# Patient Record
Sex: Female | Born: 1995
Health system: Southern US, Community
[De-identification: ages and names within clinical notes are randomized; demographics above are authoritative.]

## PROBLEM LIST (undated history)

## (undated) DIAGNOSIS — N83209 Unspecified ovarian cyst, unspecified side: Secondary | ICD-10-CM

## (undated) HISTORY — PX: TYMPANOSTOMY TUBE PLACEMENT: SHX32

---
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
## 2016-03-13 IMAGING — CR DG CHEST 2V
2 series · 2 of 2 positions shown · non-contrast
Comparison: Chest radiograph April 19, 2013

CLINICAL DATA: Chest pain radiating to LEFT breast, total body
tingling and hyperventilation.

EXAM:
CHEST  2 VIEW

[w chest pa]
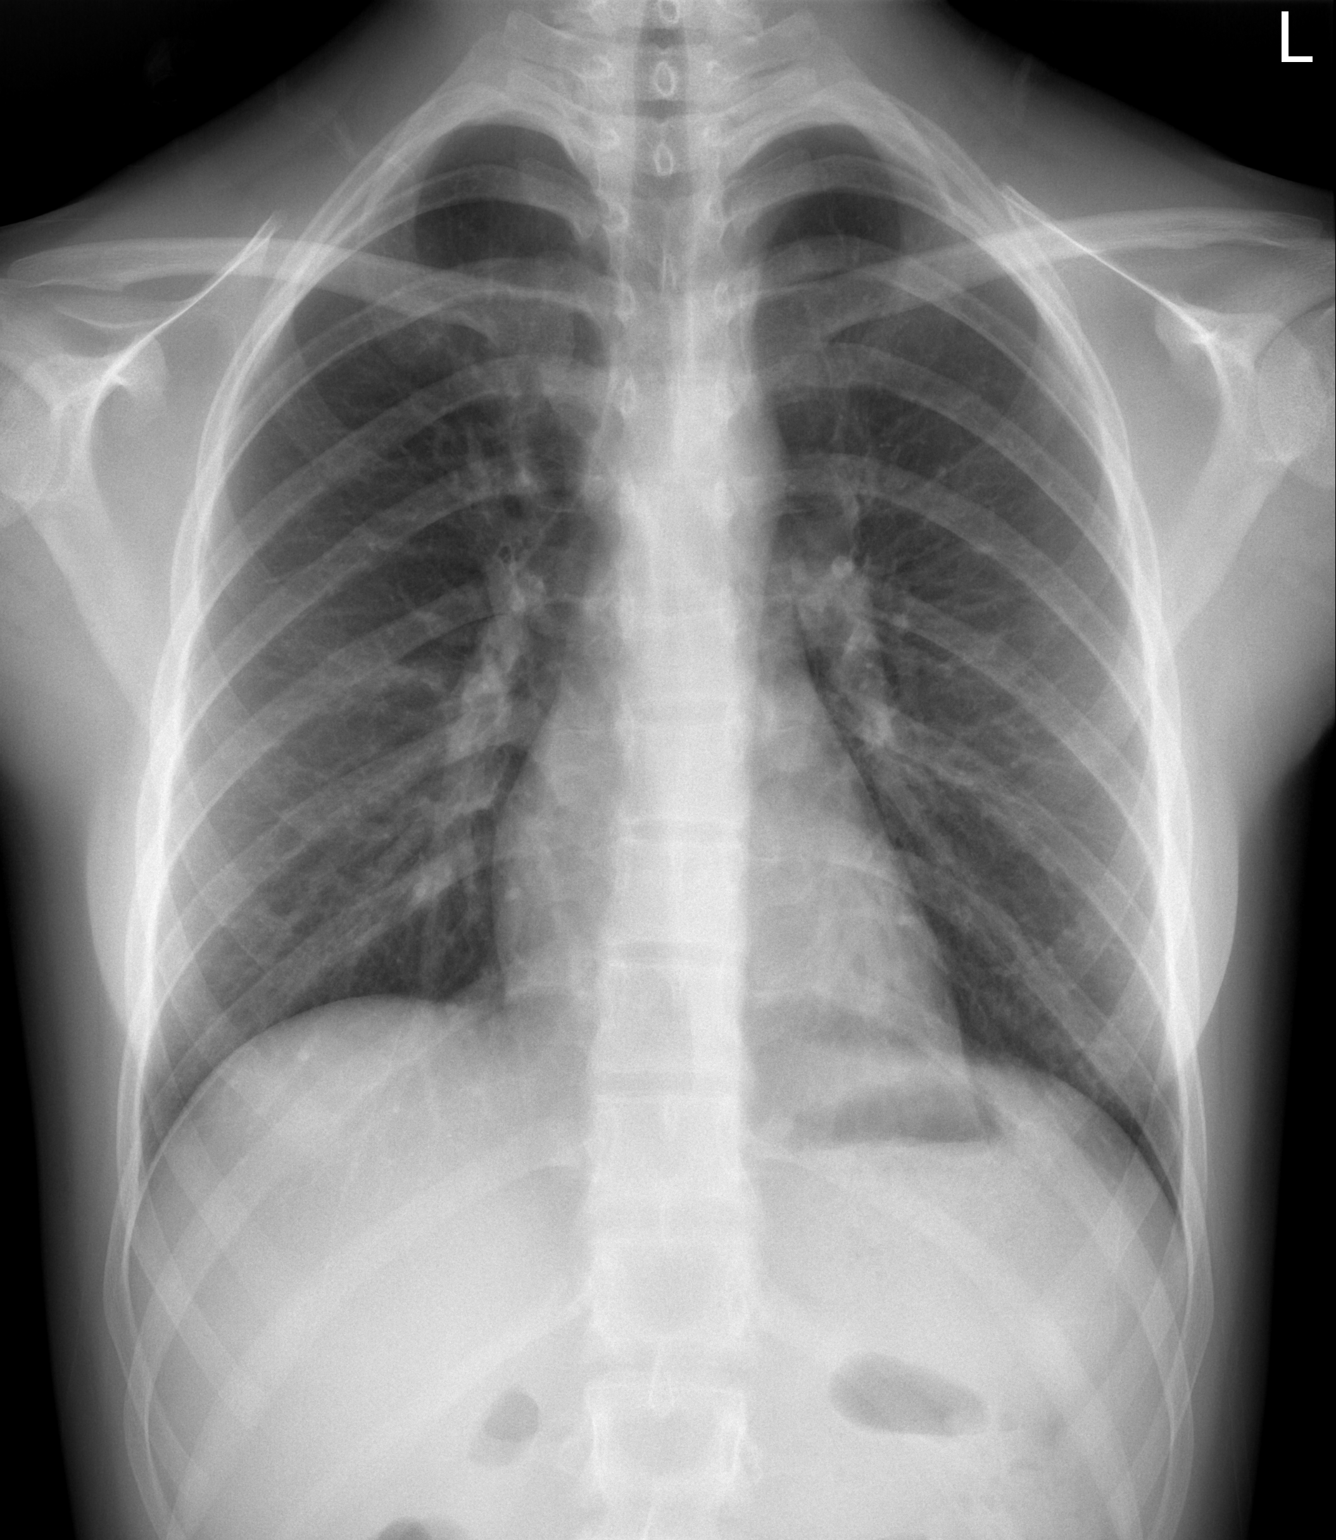

[w chest lat]
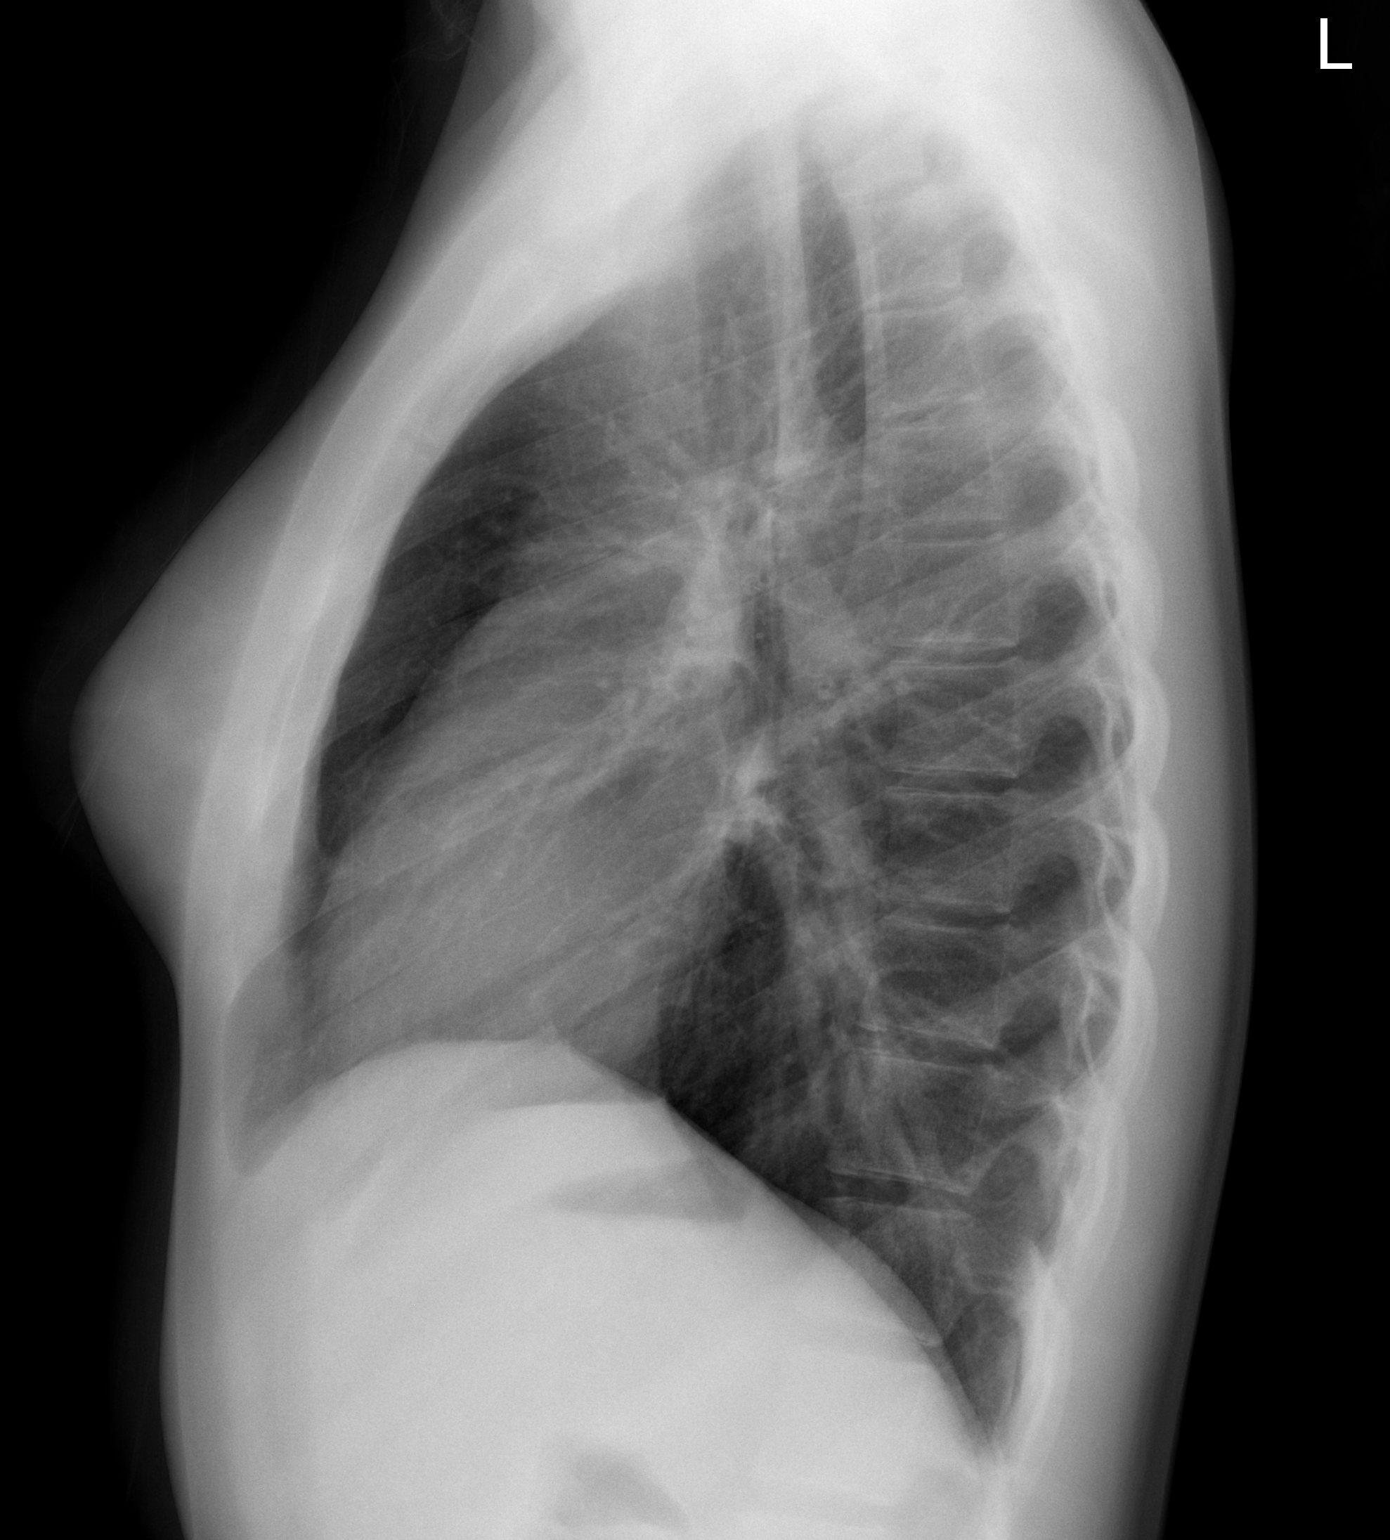

[2 of 2 positions shown; findings below may reference images not displayed]

FINDINGS: Cardiomediastinal silhouette is unremarkable. The lungs are clear
without pleural effusions or focal consolidations. Trachea projects
midline and there is no pneumothorax. Soft tissue planes and
included osseous structures are non-suspicious.
IMPRESSION: Normal chest.

  By: Adilson Eduardo Alcina

## 2016-07-16 ENCOUNTER — Encounter (HOSPITAL_BASED_OUTPATIENT_CLINIC_OR_DEPARTMENT_OTHER): Payer: Self-pay | Admitting: *Deleted

## 2016-07-16 ENCOUNTER — Emergency Department (HOSPITAL_BASED_OUTPATIENT_CLINIC_OR_DEPARTMENT_OTHER)
Admission: EM | Admit: 2016-07-16 | Discharge: 2016-07-16 | Disposition: A | Discharge status: Home / Self Care | Payer: Medicaid Other | Attending: Emergency Medicine | Admitting: Emergency Medicine

## 2016-07-16 DIAGNOSIS — K0889 Other specified disorders of teeth and supporting structures: Secondary | ICD-10-CM | POA: Insufficient documentation

## 2016-07-16 MED ORDER — BUPIVACAINE HCL (PF) 0.5 % IJ SOLN
5.0000 mL | Freq: Once | INTRAMUSCULAR | Status: DC
  Filled 2016-07-16: qty 10

## 2016-07-16 MED ORDER — AMOXICILLIN-POT CLAVULANATE 875-125 MG PO TABS: 1.0000 | ORAL_TABLET | Freq: Two times a day (BID) | ORAL | 0 refills | Status: AC

## 2016-07-16 MED ORDER — BUPIVACAINE-EPINEPHRINE (PF) 0.5% -1:200000 IJ SOLN
1.8000 mL | Freq: Once | INTRAMUSCULAR | Status: AC
  Administered 2016-07-16: 1.8 mL
  Filled 2016-07-16: qty 1.8

## 2016-07-16 MED ORDER — AMOXICILLIN-POT CLAVULANATE 875-125 MG PO TABS: 1.0000 | ORAL_TABLET | Freq: Two times a day (BID) | ORAL | 0 refills | Status: DC

## 2016-07-16 MED FILL — AMOX-CLAV 875-125 MG TABLET: 875-125 | 7 days supply | Qty: 14 | Fill #0

## 2016-07-16 NOTE — ED Triage Notes (Signed)
Possible dental abscess. She called and made an appointment for Friday as the earliest she can see her dentist.

## 2016-07-16 NOTE — ED Provider Notes (Signed)
[facility] Provider Note   CSN: [id number] Arrival date & time: 07/16/16  1212     History   Chief Complaint Chief Complaint  Patient presents with  . Dental Pain    HPI Jennifer Myers is a 21 y.o. female who presents to the Emergency Department for right-sided upper dental pain that began 2 days ago. She reports that her symptoms initially began with dental pain followed by swelling that has continued to worsen over the last 24 hours. No fever, chills, sore throat, left-sided dental pain, difficulty breathing, difficulty swallowing, or otalgia. No recent dental procedures. Patient wears braces on her upper and lower teeth. She is currently applying a warm compress to the area with some improvement. She has an appointment with her dentist on 5/11, the earliest she was able to be seen.   No pertinent PMH. No daily medications.   The history is provided by the patient. No language interpreter was used.    History reviewed. No pertinent past medical history.  Patient Active Problem List   Diagnosis Date Noted  . Injury of left shoulder 06/15/2015    Past Surgical History:  Procedure Laterality Date  . TYMPANOSTOMY TUBE PLACEMENT      OB History    No data available       Home Medications    Prior to Admission medications   Medication Sig Start Date End Date Taking? Authorizing Provider  amoxicillin-clavulanate (AUGMENTIN) 875-125 MG tablet Take 1 tablet by mouth every 12 (twelve) hours. 07/16/16   Damario Gillie A, PA-C  benzocaine (ORAJEL MAXIMUM STRENGTH) 20 % GEL Use as directed in the mouth or throat 3 (three) times daily as needed.    [provider]  cyclobenzaprine (FLEXERIL) 10 MG tablet Take 1 tablet (10 mg total) by mouth 2 (two) times daily as needed for muscle spasms. 06/12/15   Dowless, Lelon Mast Tripp, PA-C  diphenhydrAMINE (SOMINEX) 25 MG tablet Take 25 mg by mouth at bedtime as needed.    [provider]  famotidine (PEPCID)  20 MG tablet Take 1 tablet (20 mg total) by mouth 2 (two) times daily. 03/31/14   Palumbo, April, MD  HYDROcodone-acetaminophen (NORCO) 5-325 MG tablet Take 1 tablet by mouth every 6 (six) hours as needed for moderate pain. 06/13/15   Hudnall, Azucena Fallen, MD  naproxen (NAPROSYN) 500 MG tablet Take 1 tablet (500 mg total) by mouth 2 (two) times daily. 06/12/15   Dowless, Lester Kinsman, PA-C    Family History No family history on file.  Social History Social History  Substance Use Topics  . Smoking status: Never Smoker  . Smokeless tobacco: Never Used  . Alcohol use No     Allergies   Benadryl [diphenhydramine hcl (sleep)]   Review of Systems Review of Systems  Constitutional: Negative for activity change, chills and fever.  HENT: Positive for dental problem and facial swelling. Negative for drooling, ear pain, sore throat, trouble swallowing and voice change.   Respiratory: Negative for shortness of breath.   Cardiovascular: Negative for chest pain.  Gastrointestinal: Negative for abdominal pain, diarrhea, nausea and vomiting.  Musculoskeletal: Negative for back pain.  Skin: Negative for rash.  Allergic/Immunologic: Negative for immunocompromised state.  Neurological: Negative for headaches.  Psychiatric/Behavioral: Negative for confusion.     Physical Exam Updated Vital Signs BP 121/69 (BP Location: Right Arm)   Pulse 99   Temp 99.2 F (37.3 C) (Oral)   Resp 14   Ht 5\' 4"  (1.626 m)  Wt 52.2 kg   LMP 07/14/2016   SpO2 99%   BMI 19.74 kg/m   Physical Exam  Constitutional: She appears well-developed and well-nourished. No distress.  HENT:  Head: Normocephalic and atraumatic.  Right Ear: Tympanic membrane normal. Tympanic membrane is not injected, not erythematous and not bulging.  Left Ear: Tympanic membrane normal. Tympanic membrane is not injected, not erythematous and not bulging.  Nose: Nose normal. Right sinus exhibits no maxillary sinus tenderness and no frontal  sinus tenderness. Left sinus exhibits no maxillary sinus tenderness and no frontal sinus tenderness.  Mouth/Throat: Uvula is midline and oropharynx is clear and moist. She does not have dentures. No oral lesions. No trismus in the jaw. No dental abscesses, uvula swelling or dental caries.  Braces in place over the upper and lower teeth. TTP over the right maxillary teeth with buccal mucosal swelling. No obvious abscesses.   Eyes: Conjunctivae are normal.  Neck: Neck supple.  Cardiovascular: Normal rate and regular rhythm.  Exam reveals no gallop and no friction rub.   No murmur heard. Pulmonary/Chest: Effort normal and breath sounds normal. No respiratory distress. She has no wheezes. She has no rales.  Abdominal: Soft. She exhibits no distension. There is no tenderness. There is no guarding.  Musculoskeletal: She exhibits no edema.  Neurological: She is alert.  Skin: Skin is warm and dry. No rash noted. She is not diaphoretic.  Psychiatric: Her behavior is normal.  Nursing note and vitals reviewed.  ED Treatments / Results  Labs (all labs ordered are listed, but only abnormal results are displayed) Labs Reviewed - No data to display  EKG  EKG Interpretation None       Radiology No results found.  Procedures Dental Block Date/Time: 07/16/2016 4:00 PM Performed by: Lilian KapurMCDONALD, Manases Etchison A Authorized by: Frederik PearMCDONALD, Stefhanie Kachmar A   Consent:    Consent obtained:  Verbal   Consent given by:  Patient   Risks discussed:  Unsuccessful block   Alternatives discussed:  No treatment Indications:    Indications: dental pain   Location:    Block type:  Posterior superior alveolar   Laterality:  Right Post-procedure details:    Outcome:  Anesthesia achieved   Patient tolerance of procedure:  Tolerated well, no immediate complications   (including critical care time)  Medications Ordered in ED Medications  bupivacaine-epinephrine (MARCAINE W/ EPI) 0.5% -1:200000 injection 1.8 mL (1.8 mLs  Infiltration Given by Other 07/16/16 1706)     Initial Impression / Assessment and Plan / ED Course  I have reviewed the triage vital signs and the nursing notes.  Pertinent labs & imaging results that were available during my care of the patient were reviewed by me and considered in my medical decision making (see chart for details).     Patient with toothache.  No gross abscess.  Evaluated the patient with Dr. Silverio LayYao, attending physician. Exam unconcerning for Ludwig's angina or spread of infection.  Treat with effective dental block in the ED and will discharge with Augmentin. Patient has an appointment scheduled with her dentist on 5/11, which she is planning to keep. VSS. NAD. The patient is safe to discharge to home.   Final Clinical Impressions(s) / ED Diagnoses   Final diagnoses:  Pain, dental    New Prescriptions Discharge Medication List as of 07/16/2016  5:05 PM       Frederik PearMcDonald, Elfa Wooton A, PA-C 07/18/16 2126    Charlynne PanderYao, David Hsienta, MD 07/20/16 [phone]

## 2016-07-16 NOTE — ED Notes (Addendum)
Provide a warm heat pad to Pt

## 2016-07-16 NOTE — ED Notes (Signed)
ED Provider at bedside. 

## 2016-07-16 NOTE — Discharge Instructions (Signed)
Please keep your appointment with your dentist on Friday. You can take 800 mg of ibuprofen every 8 hours as needed for pain control. You can continue to apply heat or ice to the area for pain control. If you develop a fever, chills, difficulty breathing, or other worsening symptoms, please return to the Emergency Department for re-evaluation.

## 2016-08-14 ENCOUNTER — Emergency Department (HOSPITAL_BASED_OUTPATIENT_CLINIC_OR_DEPARTMENT_OTHER)
Admission: EM | Admit: 2016-08-14 | Discharge: 2016-08-14 | Disposition: A | Discharge status: Home / Self Care | Payer: Medicaid Other | Attending: Emergency Medicine | Admitting: Emergency Medicine

## 2016-08-14 ENCOUNTER — Encounter (HOSPITAL_BASED_OUTPATIENT_CLINIC_OR_DEPARTMENT_OTHER): Payer: Self-pay | Admitting: *Deleted

## 2016-08-14 DIAGNOSIS — Z5321 Procedure and treatment not carried out due to patient leaving prior to being seen by health care provider: Secondary | ICD-10-CM | POA: Diagnosis not present

## 2016-08-14 DIAGNOSIS — R6883 Chills (without fever): Secondary | ICD-10-CM | POA: Diagnosis not present

## 2016-08-14 DIAGNOSIS — R197 Diarrhea, unspecified: Secondary | ICD-10-CM | POA: Diagnosis not present

## 2016-08-14 DIAGNOSIS — R1110 Vomiting, unspecified: Secondary | ICD-10-CM | POA: Insufficient documentation

## 2016-08-14 NOTE — ED Notes (Signed)
Pt informed registration that her father was taking her Urgent Care due to wait before leaving

## 2016-08-14 NOTE — ED Triage Notes (Signed)
Pt reports vomiting, chills and abdominal pain since 1030 am. States she started her period today

## 2018-09-19 ENCOUNTER — Other Ambulatory Visit: Payer: Self-pay

## 2018-09-19 ENCOUNTER — Encounter (HOSPITAL_BASED_OUTPATIENT_CLINIC_OR_DEPARTMENT_OTHER): Payer: Self-pay | Admitting: *Deleted

## 2018-09-19 ENCOUNTER — Emergency Department (HOSPITAL_BASED_OUTPATIENT_CLINIC_OR_DEPARTMENT_OTHER)
Admission: EM | Admit: 2018-09-19 | Discharge: 2018-09-19 | Disposition: A | Discharge status: Home / Self Care | Payer: Medicaid Other | Attending: Emergency Medicine | Admitting: Emergency Medicine

## 2018-09-19 DIAGNOSIS — Z20828 Contact with and (suspected) exposure to other viral communicable diseases: Secondary | ICD-10-CM | POA: Insufficient documentation

## 2018-09-19 DIAGNOSIS — Z20822 Contact with and (suspected) exposure to covid-19: Secondary | ICD-10-CM

## 2018-09-19 DIAGNOSIS — R0789 Other chest pain: Secondary | ICD-10-CM | POA: Diagnosis present

## 2018-09-19 DIAGNOSIS — R079 Chest pain, unspecified: Secondary | ICD-10-CM

## 2018-09-19 MED ORDER — ALBUTEROL SULFATE HFA 108 (90 BASE) MCG/ACT IN AERS
2.0000 | INHALATION_SPRAY | Freq: Once | RESPIRATORY_TRACT | Status: AC
  Administered 2018-09-19: 2 via RESPIRATORY_TRACT
  Filled 2018-09-19: qty 6.7

## 2018-09-19 NOTE — ED Notes (Signed)
Pt understood dc material. NAD noted. All questions answered to satisfaction. Pt given work excuse pending Covid test. Pt also given Covid resource discharge guide.

## 2018-09-19 NOTE — ED Notes (Signed)
ED Provider at bedside. 

## 2018-09-19 NOTE — ED Triage Notes (Signed)
Pt reports that she was notified this evening that a friend that she has been around tested positive for covid.  Pt reports intermittent CP with deep inspiration. Denies fever, cough or SOB. Denies pain at this time. Ambulatory without distress.

## 2018-09-19 NOTE — ED Provider Notes (Signed)
[facility] EMERGENCY DEPARTMENT Provider Note   CSN: [id number] Arrival date & time: 09/19/18  0022    History   Chief Complaint Chief Complaint  Patient presents with  . Chest Pain    HPI Jennifer Myers is a 23 y.o. female who no significant PMHx who presents to the ED with c/o left-sided chest pain and recent covid exposure.   Jennifer Myers states she has been experiencing left-sided chest pain for the past 2-3 days. She reports that the pain is worst with deep breathes and is alleviated by sitting up straight and improving her posture. The current pain feels like it has in the past. Pain cannot be reproduced by palpation. She denies SOB.She states she has been worked up for this CP before in the past 6-7 times and was never diagnosed with anything. Last episode was several years ago. She is worried that is has been such a long time since her last episode, that this one is due to recent covid exposure.   Jennifer Myers also comes in due to recent COVID exposure. She states that for the past 2-3 days she has been hanging out with a friend, for roughly 3 hours each day, that let her know today he had tested positive for covid. She thinks his exposure either came from the gym or working at a club. She denies fever, chills, rhinorrhea, sore throat, cough, nausea, vomiting, diarrhea, abdominal pain. She is concerned current CP is due to covid exposure though.  Jennifer Myers also notes SOB and wheezing when she is exerting herself. She is wondering if her CP could be secondary to asthma. She has not been worked up for asthma yet, but has plans to do so.   The history is provided by the patient.    History reviewed. No pertinent past medical history.  Patient Active Problem List   Diagnosis Date Noted  . Injury of left shoulder 06/15/2015    Past Surgical History:  Procedure Laterality Date  . CESAREAN SECTION    . TYMPANOSTOMY TUBE PLACEMENT       OB History   No obstetric  history on file.      Home Medications    Prior to Admission medications   Medication Sig Start Date End Date Taking? Authorizing Provider  amoxicillin-clavulanate (AUGMENTIN) 875-125 MG tablet Take 1 tablet by mouth every 12 (twelve) hours. 07/16/16   McDonald, Mia A, PA-C  benzocaine (ORAJEL MAXIMUM STRENGTH) 20 % GEL Use as directed in the mouth or throat 3 (three) times daily as needed.    [provider]  cyclobenzaprine (FLEXERIL) 10 MG tablet Take 1 tablet (10 mg total) by mouth 2 (two) times daily as needed for muscle spasms. 06/12/15   Dowless, Lelon MastSamantha Tripp, PA-C  diphenhydrAMINE (SOMINEX) 25 MG tablet Take 25 mg by mouth at bedtime as needed.    [provider]  famotidine (PEPCID) 20 MG tablet Take 1 tablet (20 mg total) by mouth 2 (two) times daily. 03/31/14   Palumbo, April, MD  HYDROcodone-acetaminophen (NORCO) 5-325 MG tablet Take 1 tablet by mouth every 6 (six) hours as needed for moderate pain. 06/13/15   Hudnall, Azucena FallenShane R, MD  naproxen (NAPROSYN) 500 MG tablet Take 1 tablet (500 mg total) by mouth 2 (two) times daily. 06/12/15   Dowless, Lester KinsmanSamantha Tripp, PA-C    Family History History reviewed. No pertinent family history.  Social History Social History   Tobacco Use  . Smoking status: Never Smoker  . Smokeless tobacco: Never Used  Substance Use Topics  . Alcohol use: No    Alcohol/week: 0.0 standard drinks  . Drug use: No     Allergies   Benadryl [diphenhydramine hcl (sleep)]   Review of Systems Review of Systems  Constitutional: Negative for chills and fever.  HENT: Negative for congestion, rhinorrhea and sore throat.   Respiratory: Negative for cough, chest tightness, shortness of breath and wheezing.   Cardiovascular: Positive for chest pain.  Gastrointestinal: Negative for abdominal pain, constipation, diarrhea, nausea and vomiting.  Neurological: Negative for dizziness, weakness and headaches.  All other systems reviewed and are negative.     Physical Exam Updated Vital Signs BP 122/75   Pulse 85   Temp 98.3 F (36.8 C) (Oral)   Resp 19   Ht 5\' 4"  (1.626 m)   Wt 59 kg   LMP 08/20/2018   SpO2 100%   BMI 22.31 kg/m   Physical Exam Vitals signs and nursing note reviewed.  Constitutional:      General: She is not in acute distress.    Appearance: She is well-developed. She is not toxic-appearing.  HENT:     Head: Normocephalic and atraumatic.  Cardiovascular:     Rate and Rhythm: Normal rate and regular rhythm.     Heart sounds: Normal heart sounds. Heart sounds not distant. No murmur. No gallop. No S3 or S4 sounds.   Pulmonary:     Effort: Pulmonary effort is normal. No respiratory distress.     Breath sounds: Normal breath sounds. No decreased breath sounds, wheezing, rhonchi or rales.  Chest:     Chest wall: No deformity, tenderness, crepitus or edema.  Musculoskeletal:     Right lower leg: No edema.     Left lower leg: No edema.  Skin:    General: Skin is warm and dry.  Neurological:     General: No focal deficit present.     Mental Status: She is alert and oriented to person, place, and time.  Psychiatric:        Mood and Affect: Mood normal.        Behavior: Behavior normal.      ED Treatments / Results  Labs (all labs ordered are listed, but only abnormal results are displayed) Labs Reviewed  NOVEL CORONAVIRUS, NAA (HOSPITAL ORDER, SEND-OUT TO REF LAB)    EKG EKG Interpretation  Date/Time:  Saturday September 19 2018 02:04:20 EDT Ventricular Rate:  82 PR Interval:    QRS Duration: 80 QT Interval:  360 QTC Calculation: 421 R Axis:   76 Text Interpretation:  Sinus arrhythmia Ventricular premature complex No significant change since last tracing Confirmed by Marily MemosMesner, Jason [id number]) on 09/19/2018 2:18:20 AM   Radiology No results found.  Procedures Procedures (including critical care time)  Medications Ordered in ED Medications  albuterol (VENTOLIN HFA) 108 (90 Base) MCG/ACT inhaler 2  puff (2 puffs Inhalation Given 09/19/18 0219)     Initial Impression / Assessment and Plan / ED Course  I have reviewed the triage vital signs and the nursing notes.  Pertinent labs & imaging results that were available during my care of the patient were reviewed by me and considered in my medical decision making (see chart for details).  Ms. Vaughan BastaSummer Janalyn Myers is a 23 y/o healthy female who comes in mainly to be tested for COVID due to recent exposure, but also for evaluation of chest pain. It is worsened by deep breathes and alleviated with improving posture. Her physical exam is benign at this time.  She notes that she has been worked up for this CP before in the past, and was never found to have anything abnormal.  Differential at this time is anxiety, acute costochondritis, covid viral syndrome (given recent exposure). EKG has been ordered to rule out arrhythmia and ACS. It did show a possible PAC. No acute changes to suggest life-threatening arrhythmia or ACS.  In addition, due to recent exposure to a friend that has tested positive, a COVID send out has been ordered.    At this time, patient is safe for discharge. We discussed following up with her PCP for further work up of her CP, such as possible asthma. Given she does have symptoms consistent with asthma with exertion, she was discharged with orders for an albuterol inhaler.   Final Clinical Impressions(s) / ED Diagnoses   Final diagnoses:  Nonspecific chest pain  Close Exposure to Covid-19 Virus    ED Discharge Orders    None       Jose Persia, MD 09/19/18 [id number]    Merrily Pew, MD 09/20/18 [phone]

## 2018-09-24 LAB — NOVEL CORONAVIRUS, NAA (HOSP ORDER, SEND-OUT TO REF LAB; TAT 18-24 HRS): SARS-CoV-2, NAA: NOT DETECTED

## 2020-04-04 ENCOUNTER — Ambulatory Visit: Payer: Medicaid Other | Admitting: Podiatry

## 2020-04-10 ENCOUNTER — Ambulatory Visit (INDEPENDENT_AMBULATORY_CARE_PROVIDER_SITE_OTHER): Payer: Medicaid Other | Admitting: Podiatry

## 2020-04-10 ENCOUNTER — Encounter: Payer: Self-pay | Admitting: Podiatry

## 2020-04-10 ENCOUNTER — Ambulatory Visit (INDEPENDENT_AMBULATORY_CARE_PROVIDER_SITE_OTHER): Payer: Medicaid Other

## 2020-04-10 ENCOUNTER — Other Ambulatory Visit: Payer: Self-pay

## 2020-04-10 DIAGNOSIS — M722 Plantar fascial fibromatosis: Secondary | ICD-10-CM | POA: Diagnosis not present

## 2020-04-10 MED ORDER — MELOXICAM 15 MG PO TABS: 15.0000 mg | ORAL_TABLET | Freq: Every day | ORAL | 3 refills | Status: AC

## 2020-04-10 MED ORDER — DEXAMETHASONE SODIUM PHOSPHATE 4 MG/ML IJ SOLN
4.0000 mg | Freq: Once | INTRAMUSCULAR | Status: AC
  Administered 2020-04-10: 4 mg

## 2020-04-10 MED ORDER — TRIAMCINOLONE ACETONIDE 40 MG/ML IJ SUSP
10.0000 mg | Freq: Once | INTRAMUSCULAR | Status: AC
  Administered 2020-04-10: 10 mg

## 2020-04-10 NOTE — Progress Notes (Signed)
  Subjective:  Patient ID: Jennifer Myers, female    DOB: January 04, 1996,  MRN: [id number]  Chief Complaint  Patient presents with  . Foot Pain    Right heel pain. Pain is in the back of the heel Pt stated that it is very tender to the touch and painful with pressure    25 y.o. female presents with the above complaint. History confirmed with patient.   Objective:  Physical Exam: warm, good capillary refill, no trophic changes or ulcerative lesions, normal DP and PT pulses and normal sensory exam.   Right Foot: point tenderness over the heel pad   No images are attached to the encounter.  Radiographs: X-ray of the right foot: no fracture, dislocation, swelling or degenerative changes noted Assessment:   1. Plantar fasciitis of right foot      Plan:  Patient was evaluated and treated and all questions answered.  Discussed the etiology and treatment options for plantar fasciitis including stretching, formal physical therapy, supportive shoegears such as a running shoe or sneaker, pre fabricated orthoses, injection therapy, and oral medications. We also discussed the role of surgical treatment of this for patients who do not improve after exhausting non-surgical treatment options.    -XR reviewed with patient -Educated patient on stretching and icing of the affected limb -Injection delivered to the plantar fascia of the right foot. -Rx for meloxicam. Educated on use, risks and benefits of the medication  After sterile prep with povidone-iodine solution and alcohol, the right heel was injected with 1cc 0.5% marcaine plain, 10mg  triamcinolone acetonide, and 4mg  dexamethasone was injected along the plantar fascia at the insertion on the plantar calcaneus. The patient tolerated the procedure well without complication.   Return in about 1 month (around 05/08/2020) for recheck plantar fasciitis.

## 2020-04-10 NOTE — Patient Instructions (Signed)

## 2020-05-11 ENCOUNTER — Ambulatory Visit (INDEPENDENT_AMBULATORY_CARE_PROVIDER_SITE_OTHER): Payer: Medicaid Other

## 2020-05-11 ENCOUNTER — Other Ambulatory Visit: Payer: Self-pay

## 2020-05-11 ENCOUNTER — Ambulatory Visit: Payer: Medicaid Other | Admitting: Podiatry

## 2020-05-11 DIAGNOSIS — M722 Plantar fascial fibromatosis: Secondary | ICD-10-CM

## 2020-05-11 DIAGNOSIS — G5791 Unspecified mononeuropathy of right lower limb: Secondary | ICD-10-CM | POA: Diagnosis not present

## 2020-05-14 NOTE — Progress Notes (Signed)
  Subjective:  Patient ID: Jennifer Myers, female    DOB: [birth year],  MRN: [id number]  Chief Complaint  Patient presents with  . Plantar Fasciitis    Right foot PT stated that she feels like she may have over worked her foot because she has a pain on the top of her foot    25 y.o. female returns with the above complaint. History confirmed with patient.  The injection helped and get rid of her heel pain.  She has more pain in the top of the right foot.  She states that she sleeps a lot on her stomach and keeps her foot from the neck exposition  Objective:  Physical Exam: warm, good capillary refill, no trophic changes or ulcerative lesions, normal DP and PT pulses and normal sensory exam.   Right Foot: Pain over the dorsal forefoot  Radiographs: X-ray of the right foot: no fracture, dislocation, swelling or degenerative changes noted, no definite evidence of stress fracture Assessment:   1. Plantar fasciitis of right foot      Plan:  Patient was evaluated and treated and all questions answered.  Heel pain has resolved.  Continue anti-inflammatories and stretching icing as needed if it comes back  For this new pain she has it appears that she is keeping her foot in a maximally plantarflexed position and is likely giving her traction neuritis of the lateral cutaneous nerves.  Advised her to change her sleeping positions, there is no direct evidence of stress fracture today.  I would expect new x-rays in a few weeks to reevaluate this   No follow-ups on file.

## 2020-06-13 ENCOUNTER — Ambulatory Visit: Payer: Medicaid Other | Admitting: Podiatry

## 2021-01-26 ENCOUNTER — Emergency Department (HOSPITAL_BASED_OUTPATIENT_CLINIC_OR_DEPARTMENT_OTHER): Payer: Medicaid Other

## 2021-01-26 ENCOUNTER — Encounter (HOSPITAL_BASED_OUTPATIENT_CLINIC_OR_DEPARTMENT_OTHER): Payer: Self-pay

## 2021-01-26 ENCOUNTER — Emergency Department (HOSPITAL_BASED_OUTPATIENT_CLINIC_OR_DEPARTMENT_OTHER)
Admission: EM | Admit: 2021-01-26 | Discharge: 2021-01-26 | Disposition: A | Discharge status: Home / Self Care | Payer: Medicaid Other | Attending: Emergency Medicine | Admitting: Emergency Medicine

## 2021-01-26 ENCOUNTER — Other Ambulatory Visit: Payer: Self-pay

## 2021-01-26 DIAGNOSIS — R102 Pelvic and perineal pain: Secondary | ICD-10-CM | POA: Diagnosis present

## 2021-01-26 DIAGNOSIS — N83201 Unspecified ovarian cyst, right side: Secondary | ICD-10-CM | POA: Insufficient documentation

## 2021-01-26 HISTORY — DX: Unspecified ovarian cyst, unspecified side: N83.209

## 2021-01-26 LAB — COMPREHENSIVE METABOLIC PANEL
ALT: 10 U/L (ref 0–44)
AST: 16 U/L (ref 15–41)
Albumin: 4.1 g/dL (ref 3.5–5.0)
Alkaline Phosphatase: 67 U/L (ref 38–126)
Anion gap: 5 (ref 5–15)
BUN: 13 mg/dL (ref 6–20)
CO2: 25 mmol/L (ref 22–32)
Calcium: 9 mg/dL (ref 8.9–10.3)
Chloride: 108 mmol/L (ref 98–111)
Creatinine, Ser: 0.56 mg/dL (ref 0.44–1.00)
GFR, Estimated: >60 mL/min (ref >60)
Glucose, Bld: 93 mg/dL (ref 70–99)
Potassium: 4.1 mmol/L (ref 3.5–5.1)
Sodium: 138 mmol/L (ref 135–145)
Total Bilirubin: 0.4 mg/dL (ref 0.3–1.2)
Total Protein: 7.3 g/dL (ref 6.5–8.1)

## 2021-01-26 LAB — WET PREP, GENITAL
Clue Cells Wet Prep HPF POC: NONE SEEN
Sperm: NONE SEEN
Trich, Wet Prep: NONE SEEN
WBC, Wet Prep HPF POC: >=10 — AB (ref <10)
Yeast Wet Prep HPF POC: NONE SEEN

## 2021-01-26 LAB — CBC WITH DIFFERENTIAL/PLATELET
Abs Immature Granulocytes: 0.02 10*3/uL (ref 0.00–0.07)
Basophils Absolute: 0.1 10*3/uL (ref 0.0–0.1)
Basophils Relative: 1 %
Eosinophils Absolute: 0.5 10*3/uL (ref 0.0–0.5)
Eosinophils Relative: 6 %
HCT: 39.5 % (ref 36.0–46.0)
Hemoglobin: 13.3 g/dL (ref 12.0–15.0)
Immature Granulocytes: 0 %
Lymphocytes Relative: 23 %
Lymphs Abs: 1.9 10*3/uL (ref 0.7–4.0)
MCH: 27.1 pg (ref 26.0–34.0)
MCHC: 33.7 g/dL (ref 30.0–36.0)
MCV: 80.6 fL (ref 80.0–100.0)
Monocytes Absolute: 0.6 10*3/uL (ref 0.1–1.0)
Monocytes Relative: 7 %
Neutro Abs: 5.2 10*3/uL (ref 1.7–7.7)
Neutrophils Relative %: 63 %
Platelets: 300 10*3/uL (ref 150–400)
RBC: 4.9 MIL/uL (ref 3.87–5.11)
RDW: 13.7 % (ref 11.5–15.5)
WBC: 8.3 10*3/uL (ref 4.0–10.5)
nRBC: 0 % (ref 0.0–0.2)

## 2021-01-26 LAB — URINALYSIS, ROUTINE W REFLEX MICROSCOPIC
Bilirubin Urine: NEGATIVE
Glucose, UA: NEGATIVE mg/dL
Hgb urine dipstick: NEGATIVE
Ketones, ur: NEGATIVE mg/dL
Leukocytes,Ua: NEGATIVE
Nitrite: NEGATIVE
Protein, ur: NEGATIVE mg/dL
Specific Gravity, Urine: >1.03 — ABNORMAL HIGH (ref 1.005–1.030)
pH: 6.5 (ref 5.0–8.0)

## 2021-01-26 LAB — PREGNANCY, URINE: Preg Test, Ur: NEGATIVE

## 2021-01-26 NOTE — ED Triage Notes (Signed)
Pt reports sharp pain left lower abdominal off and on for 4 days. Has history of ovarian cysts.  Denies burning with urination or vaginal discharge.

## 2021-01-26 NOTE — Discharge Instructions (Addendum)
You are seen in the Emergency Department today for pelvic pain.  While you are here we did a thorough work-up which was all reassuring.  Is likely that you are just having ovulatory pain or ovarian cyst pain.  You have an ovarian cyst on the right side.  The rest of your labs are all reassuring.  I like you to follow-up with your OB/GYN regarding this in the next week or 2.  Please return to emergency department if begin have fever, abdominal symptoms or increasing severity of pain.

## 2021-01-26 NOTE — ED Provider Notes (Signed)
[facility] EMERGENCY DEPARTMENT Provider Note   CSN: [id number] Arrival date & time: 01/26/21  1310     History Chief Complaint  Patient presents with   Pelvic Pain    Jennifer Myers is a 25 y.o. female.  With past medical history of ovarian cysts who presents emergency department with pelvic pain.  Patient complains of 4 days of intermittent, sharp, nonradiating left lower quadrant/pelvic pain.  Associated with nausea without vomiting.  She states that pain lasts for 1 to 2 hours at a time and then abates.  No aggravating or alleviating factors.  She denies fevers, vaginal bleeding or new discharge, dysuria, hematuria.  She does have a history of chlamydia 2 years ago but states she was treated for this.  Last sexual intercourse was 3 weeks ago.  Last menstrual period 01/02/2021.  States that she has a history of frequent ovarian cysts and the pain feels similar to this however this pain is lingering longer.  She had OB/GYN appointment last week but did not bring this up because she was not having pain at the time.  Called her OB/GYN today but they did not have any appointments and instructed her to present to urgent care.  She has a history of C-section in 2019 with 1 living child.  No other pregnancies.   Pelvic Pain Pertinent negatives include no abdominal pain.      Past Medical History:  Diagnosis Date   Ovarian cyst     Patient Active Problem List   Diagnosis Date Noted   Injury of left shoulder 06/15/2015    Past Surgical History:  Procedure Laterality Date   CESAREAN SECTION     TYMPANOSTOMY TUBE PLACEMENT       OB History   No obstetric history on file.     No family history on file.  Social History   Tobacco Use   Smoking status: Never   Smokeless tobacco: Never  Vaping Use   Vaping Use: Every day  Substance Use Topics   Alcohol use: No    Alcohol/week: 0.0 standard drinks   Drug use: No    Home Medications Prior to Admission  medications   Medication Sig Start Date End Date Taking? Authorizing Provider  amoxicillin-clavulanate (AUGMENTIN) 875-125 MG tablet Take 1 tablet by mouth every 12 (twelve) hours. 07/16/16   McDonald, Mia A, PA-C  benzocaine (ORAJEL MAXIMUM STRENGTH) 20 % GEL Use as directed in the mouth or throat 3 (three) times daily as needed.    [provider]  cyclobenzaprine (FLEXERIL) 10 MG tablet Take 1 tablet (10 mg total) by mouth 2 (two) times daily as needed for muscle spasms. 06/12/15   Dowless, Aldona Bar Tripp, PA-C  diphenhydrAMINE (SOMINEX) 25 MG tablet Take 25 mg by mouth at bedtime as needed.    [provider]  famotidine (PEPCID) 20 MG tablet Take 1 tablet (20 mg total) by mouth 2 (two) times daily. 03/31/14   Palumbo, April, MD  HYDROcodone-acetaminophen (NORCO) 5-325 MG tablet Take 1 tablet by mouth every 6 (six) hours as needed for moderate pain. 06/13/15   Hudnall, Sharyn Lull, MD  meloxicam (MOBIC) 15 MG tablet Take 1 tablet (15 mg total) by mouth daily. 04/10/20   McDonald, Stephan Minister, DPM  naproxen (NAPROSYN) 500 MG tablet Take 1 tablet (500 mg total) by mouth 2 (two) times daily. 06/12/15   Dowless, Dondra Spry, PA-C    Allergies    Benadryl [diphenhydramine hcl (sleep)]  Review of Systems  Review of Systems  Constitutional:  Negative for fever.  Gastrointestinal:  Positive for nausea. Negative for abdominal pain, blood in stool, diarrhea and vomiting.  Genitourinary:  Positive for pelvic pain. Negative for dysuria, menstrual problem, vaginal bleeding, vaginal discharge and vaginal pain.  Neurological:  Negative for dizziness and light-headedness.  All other systems reviewed and are negative.  Physical Exam Updated Vital Signs BP 117/84 (BP Location: Left Arm)   Pulse (!) 104   Temp 97.6 F (36.4 C) (Oral)   Resp 16   Ht 5\' 4"  (1.626 m)   Wt 56.7 kg   LMP 01/02/2021   SpO2 98%   BMI 21.46 kg/m   Physical Exam Vitals and nursing note reviewed. Exam conducted with  a chaperone present.  Constitutional:      General: She is not in acute distress.    Appearance: Normal appearance. She is not ill-appearing or toxic-appearing.  HENT:     Head: Normocephalic.     Nose: Nose normal.     Mouth/Throat:     Mouth: Mucous membranes are moist.  Eyes:     General: No scleral icterus.    Extraocular Movements: Extraocular movements intact.     Pupils: Pupils are equal, round, and reactive to light.  Cardiovascular:     Rate and Rhythm: Normal rate and regular rhythm.     Pulses: Normal pulses.     Heart sounds: No murmur heard. Pulmonary:     Effort: Pulmonary effort is normal. No respiratory distress.     Breath sounds: Normal breath sounds.  Abdominal:     General: Abdomen is flat. Bowel sounds are normal. There is no distension.     Palpations: Abdomen is soft.     Tenderness: There is abdominal tenderness in the left lower quadrant. There is no right CVA tenderness, left CVA tenderness, guarding or rebound. Negative signs include Murphy's sign, Rovsing's sign and McBurney's sign.    Genitourinary:    General: Normal vulva.     Exam position: Lithotomy position.     Tanner stage (genital): 5.     Labia:        Right: No lesion.      Vagina: Normal. No vaginal discharge, erythema, bleeding or lesions.     Cervix: No cervical motion tenderness, lesion, erythema or cervical bleeding.     Adnexa:        Left: Tenderness present.   Musculoskeletal:        General: Normal range of motion.     Cervical back: Normal range of motion.  Skin:    General: Skin is warm and dry.     Capillary Refill: Capillary refill takes less than 2 seconds.  Neurological:     General: No focal deficit present.     Mental Status: She is alert and oriented to person, place, and time. Mental status is at baseline.  Psychiatric:        Mood and Affect: Mood normal.        Behavior: Behavior normal.    ED Results / Procedures / Treatments   Labs (all labs ordered are  listed, but only abnormal results are displayed) Labs Reviewed  WET PREP, GENITAL - Abnormal; Notable for the following components:      Result Value   WBC, Wet Prep HPF POC >=10 (*)    All other components within normal limits  URINALYSIS, ROUTINE W REFLEX MICROSCOPIC - Abnormal; Notable for the following components:   Specific Gravity, Urine >1.030 (*)  All other components within normal limits  PREGNANCY, URINE  CBC WITH DIFFERENTIAL/PLATELET  COMPREHENSIVE METABOLIC PANEL  GC/CHLAMYDIA PROBE AMP ([facility]) NOT AT [facility]   EKG None  Radiology US PELVIC COMPLETE W TRANSVAGINAL AND TORSION R/O  Result Date: 01/26/2021 CLINICAL DATA:  Pelvic pain, left greater than right. History of C-section. EXAM: TRANSABDOMINAL AND TRANSVAGINAL ULTRASOUND OF PELVIS TECHNIQUE: Both transabdominal and transvaginal ultrasound examinations of the pelvis were performed. Transabdominal technique was performed for global imaging of the pelvis including uterus, ovaries, adnexal regions, and pelvic cul-de-sac. It was necessary to proceed with endovaginal exam following the transabdominal exam to visualize the ovaries and endometrium. COMPARISON:  None FINDINGS: Uterus Measurements: 7.6 x 3.3 x 4.5 cm = volume: 60 mL. No fibroids or other mass visualized. Endometrium Thickness: 6 mm.  No focal abnormality visualized. Right ovary Measurements: 4.8 x 2.9 x 4.2 cm = volume: 31 mL. There is a 4.7 by 3.8 x 4.1 cm cyst with thin internal septations in the right ovary. Left ovary Not visualized. Other findings Moderate free fluid. IMPRESSION: 1. 4.7 cm complex cyst in the right ovary may represent hemorrhagic cysts. Follow-up pelvic ultrasound recommended in 2 months or 2 menstrual cycles to confirm resolution. 2. Moderate free fluid in the pelvis. 3. Nonvisualization of the left ovary. Electronically Signed   By: Darliss Cheney M.D.   On: 01/26/2021 16:45    Procedures Procedures   Medications Ordered in  ED Medications - No data to display  ED Course  I have reviewed the triage vital signs and the nursing notes.  Pertinent labs & imaging results that were available during my care of the patient were reviewed by me and considered in my medical decision making (see chart for details).    MDM Rules/Calculators/A&P 25 year old female who presents emergency department with pelvic pain.  Not pregnant Abdominal exam is without peritoneal findings.  No evidence of an acute abdomen.  She is well-appearing.  The patient with pelvic pain and no CMT, vaginal discharge.  Ultrasound without evidence of torsion.  Given her presentation of symptoms and work-up I have low suspicion for acute hepatobiliary disease, acute pancreatitis, PUD or gastric perforation, infectious process like pneumonia or pyelonephritis.  Doubt acute appendicitis.  Doubt bowel obstruction or viscus perforation, diverticulitis. Presentation is not consistent with other acute, emergent cause abdominal pain. UA negative for UTI She does have left sided adnexal tenderness.  The ultrasound was unable to visualize the left ovary however I have very low suspicion for ovarian torsion or TOA, PID given her presentation of symptoms.  She was just seen by OB/GYN on 01/22/2021 and was negative for STI.  She has no leukocytosis or fever concerning for sepsis or occult infection. There is moderate free fluid in the abdomen which may be from recently ruptured ovarian cyst.  She does have a complex cyst on the right ovary without right adnexal tenderness.  Given her work-up, we will have her follow-up with OB/GYN however her vitals are stable and I believe that she is safe for discharge at this time.  Instructed her to return to the emergency department with increasing abdominal pain, distention or fevers.  Verbalizes understanding with teach back Final Clinical Impression(s) / ED Diagnoses Final diagnoses:  Pelvic pain in female    Rx / DC  Orders ED Discharge Orders     None        Cristopher Peru, PA-C 01/26/21 1820    Vanetta Mulders, MD 01/27/21 [phone]

## 2021-01-26 NOTE — ED Notes (Signed)
Will update vitals when returning from [place]

## 2021-01-29 LAB — GC/CHLAMYDIA PROBE AMP (~~LOC~~) NOT AT ARMC
Chlamydia: NEGATIVE
Comment: NEGATIVE
Comment: NORMAL
Neisseria Gonorrhea: NEGATIVE

## 2021-04-19 ENCOUNTER — Encounter (HOSPITAL_COMMUNITY): Payer: Self-pay | Admitting: Radiology

## 2022-08-29 ENCOUNTER — Emergency Department (HOSPITAL_COMMUNITY)
Admission: EM | Admit: 2022-08-29 | Discharge: 2022-08-29 | Disposition: A | Discharge status: Home / Self Care | Payer: Medicaid Other | Attending: Emergency Medicine | Admitting: Emergency Medicine

## 2022-08-29 ENCOUNTER — Encounter (HOSPITAL_COMMUNITY): Payer: Self-pay

## 2022-08-29 DIAGNOSIS — R519 Headache, unspecified: Secondary | ICD-10-CM | POA: Insufficient documentation

## 2022-08-29 DIAGNOSIS — R109 Unspecified abdominal pain: Secondary | ICD-10-CM | POA: Insufficient documentation

## 2022-08-29 LAB — COMPREHENSIVE METABOLIC PANEL
ALT: 19 U/L (ref 0–44)
AST: 17 U/L (ref 15–41)
Albumin: 3.9 g/dL (ref 3.5–5.0)
Alkaline Phosphatase: 68 U/L (ref 38–126)
Anion gap: 8 (ref 5–15)
BUN: 8 mg/dL (ref 6–20)
CO2: 19 mmol/L — ABNORMAL LOW (ref 22–32)
Calcium: 8.9 mg/dL (ref 8.9–10.3)
Chloride: 107 mmol/L (ref 98–111)
Creatinine, Ser: 0.47 mg/dL (ref 0.44–1.00)
GFR, Estimated: >60 mL/min (ref >60)
Glucose, Bld: 86 mg/dL (ref 70–99)
Potassium: 3.6 mmol/L (ref 3.5–5.1)
Sodium: 134 mmol/L — ABNORMAL LOW (ref 135–145)
Total Bilirubin: 0.7 mg/dL (ref 0.3–1.2)
Total Protein: 7.5 g/dL (ref 6.5–8.1)

## 2022-08-29 LAB — CBC
HCT: 38.6 % (ref 36.0–46.0)
Hemoglobin: 12.8 g/dL (ref 12.0–15.0)
MCH: 26.4 pg (ref 26.0–34.0)
MCHC: 33.2 g/dL (ref 30.0–36.0)
MCV: 79.6 fL — ABNORMAL LOW (ref 80.0–100.0)
Platelets: 297 10*3/uL (ref 150–400)
RBC: 4.85 MIL/uL (ref 3.87–5.11)
RDW: 13.8 % (ref 11.5–15.5)
WBC: 8.4 10*3/uL (ref 4.0–10.5)
nRBC: 0 % (ref 0.0–0.2)

## 2022-08-29 LAB — URINALYSIS, ROUTINE W REFLEX MICROSCOPIC
Bacteria, UA: NONE SEEN
Bilirubin Urine: NEGATIVE
Glucose, UA: NEGATIVE mg/dL
Hgb urine dipstick: NEGATIVE
Ketones, ur: 5 mg/dL — AB
Nitrite: NEGATIVE
Protein, ur: NEGATIVE mg/dL
Specific Gravity, Urine: 1.018 (ref 1.005–1.030)
pH: 6 (ref 5.0–8.0)

## 2022-08-29 MED ORDER — ONDANSETRON 4 MG PO TBDP
4.0000 mg | ORAL_TABLET | Freq: Once | ORAL | Status: AC | PRN
  Administered 2022-08-29: 4 mg via ORAL
  Filled 2022-08-29: qty 1

## 2022-08-29 NOTE — ED Triage Notes (Signed)
Pt reports sharp bilateral flank pains and headache x2 hours. [redacted] weeks pregnant. Told by OB to come to ED for HA over 2 hours unrelieved by tylenol. Unable to try tylenol d/t emesis. Hx preeclampsia. Getting care [facility] in [place] off [address]. Denies urinary sx.

## 2022-08-29 NOTE — Discharge Instructions (Signed)
Drink plenty of fluids.  Follow-up with your [facility] doctor

## 2022-08-29 NOTE — ED Provider Notes (Signed)
[place] EMERGENCY DEPARTMENT AT [facility] Provider Note   CSN: [id number] Arrival date & time: 08/29/22  1215     History  Chief Complaint  Patient presents with   Abdominal Pain   Headache    Jennifer Myers is a 27 y.o. female.  27 year old female presents with bilateral abdominal cramping without associated uterine cramping.  No vaginal bleeding.  Patient has history of preeclampsia and is concerned about this.  Patient blood pressure was normal according to her at her last OB visit.  She has had a mild headache without emesis for 2 hours.  Denies any neck pain.  Denies any visual changes.  Attempted take Tylenol without relief       Home Medications Prior to Admission medications   Medication Sig Start Date End Date Taking? Authorizing Provider  amoxicillin-clavulanate (AUGMENTIN) 875-125 MG tablet Take 1 tablet by mouth every 12 (twelve) hours. 07/16/16   McDonald, Mia A, PA-C  benzocaine (ORAJEL MAXIMUM STRENGTH) 20 % GEL Use as directed in the mouth or throat 3 (three) times daily as needed.    [provider]  cyclobenzaprine (FLEXERIL) 10 MG tablet Take 1 tablet (10 mg total) by mouth 2 (two) times daily as needed for muscle spasms. 06/12/15   Dowless, Lelon Mast Tripp, PA-C  diphenhydrAMINE (SOMINEX) 25 MG tablet Take 25 mg by mouth at bedtime as needed.    [provider]  famotidine (PEPCID) 20 MG tablet Take 1 tablet (20 mg total) by mouth 2 (two) times daily. 03/31/14   Palumbo, April, MD  HYDROcodone-acetaminophen (NORCO) 5-325 MG tablet Take 1 tablet by mouth every 6 (six) hours as needed for moderate pain. 06/13/15   Hudnall, Azucena Fallen, MD  meloxicam (MOBIC) 15 MG tablet Take 1 tablet (15 mg total) by mouth daily. 04/10/20   McDonald, Rachelle Hora, DPM  naproxen (NAPROSYN) 500 MG tablet Take 1 tablet (500 mg total) by mouth 2 (two) times daily. 06/12/15   Dowless, Lelon Mast Tripp, PA-C      Allergies    Benadryl [diphenhydramine hcl (sleep)]     Review of Systems   Review of Systems  All other systems reviewed and are negative.   Physical Exam Updated Vital Signs BP (!) 134/96 (BP Location: Right Arm)   Pulse 95   Temp 98 F (36.7 C) (Oral)   Resp 18   Ht 1.626 m (5\' 4" )   Wt 55.8 kg   SpO2 100%   BMI 21.11 kg/m  Physical Exam Vitals and nursing note reviewed.  Constitutional:      General: She is not in acute distress.    Appearance: Normal appearance. She is well-developed. She is not toxic-appearing.  HENT:     Head: Normocephalic and atraumatic.  Eyes:     General: Lids are normal.     Conjunctiva/sclera: Conjunctivae normal.     Pupils: Pupils are equal, round, and reactive to light.  Neck:     Thyroid: No thyroid mass.     Trachea: No tracheal deviation.  Cardiovascular:     Rate and Rhythm: Normal rate and regular rhythm.     Heart sounds: Normal heart sounds. No murmur heard.    No gallop.  Pulmonary:     Effort: Pulmonary effort is normal. No respiratory distress.     Breath sounds: Normal breath sounds. No stridor. No decreased breath sounds, wheezing, rhonchi or rales.  Abdominal:     General: There is no distension.     Palpations: Abdomen is  soft.     Tenderness: There is no abdominal tenderness. There is no rebound.  Musculoskeletal:        General: No tenderness. Normal range of motion.     Cervical back: Normal range of motion and neck supple.  Skin:    General: Skin is warm and dry.     Findings: No abrasion or rash.  Neurological:     Mental Status: She is alert and oriented to person, place, and time. Mental status is at baseline.     GCS: GCS eye subscore is 4. GCS verbal subscore is 5. GCS motor subscore is 6.     Cranial Nerves: No cranial nerve deficit.     Sensory: No sensory deficit.     Motor: Motor function is intact.  Psychiatric:        Attention and Perception: Attention normal.        Speech: Speech normal.        Behavior: Behavior normal.     ED Results /  Procedures / Treatments   Labs (all labs ordered are listed, but only abnormal results are displayed) Labs Reviewed  COMPREHENSIVE METABOLIC PANEL - Abnormal; Notable for the following components:      Result Value   Sodium 134 (*)    CO2 19 (*)    All other components within normal limits  CBC - Abnormal; Notable for the following components:   MCV 79.6 (*)    All other components within normal limits  URINALYSIS, ROUTINE W REFLEX MICROSCOPIC    EKG None  Radiology No results found.  Procedures Procedures    Medications Ordered in ED Medications  ondansetron (ZOFRAN-ODT) disintegrating tablet 4 mg (4 mg Oral Given 08/29/22 1245)    ED Course/ Medical Decision Making/ A&P                             Medical Decision Making Amount and/or Complexity of Data Reviewed Labs: ordered.  Risk Prescription drug management.   Patient's urinalysis shows slight ketones but no protein.  Repeat blood pressure is 110/60.  Patient presents for headache which she has deferred at this time.  Does not appear to have preeclampsia at this time.  Her LFTs are normal as well to.  She has no focal neurological findings.  Nonetheless she needs to have a head CT at this time.  Offered IV fluids as she says she has had some issues with emesis but she has deferred.  She will follow-up with her doctor        Final Clinical Impression(s) / ED Diagnoses Final diagnoses:  None    Rx / DC Orders ED Discharge Orders     None         Lorre Nick, MD 08/29/22 1422

## 2023-01-09 IMAGING — US US PELVIS COMPLETE TRANSABD/TRANSVAG W DUPLEX
1 series · 13 of 25 positions shown · non-contrast
Comparison: None

CLINICAL DATA: Pelvic pain, left greater than right. History of
C-section.

EXAM:
TRANSABDOMINAL AND TRANSVAGINAL ULTRASOUND OF PELVIS
TECHNIQUE: Both transabdominal and transvaginal ultrasound examinations of the
pelvis were performed. Transabdominal technique was performed for
global imaging of the pelvis including uterus, ovaries, adnexal
regions, and pelvic cul-de-sac. It was necessary to proceed with
endovaginal exam following the transabdominal exam to visualize the
ovaries and endometrium.

[Series 1: us pelvis complete transabd/transvag w duplex · 13 of 138 slices shown]
[im 1/138]
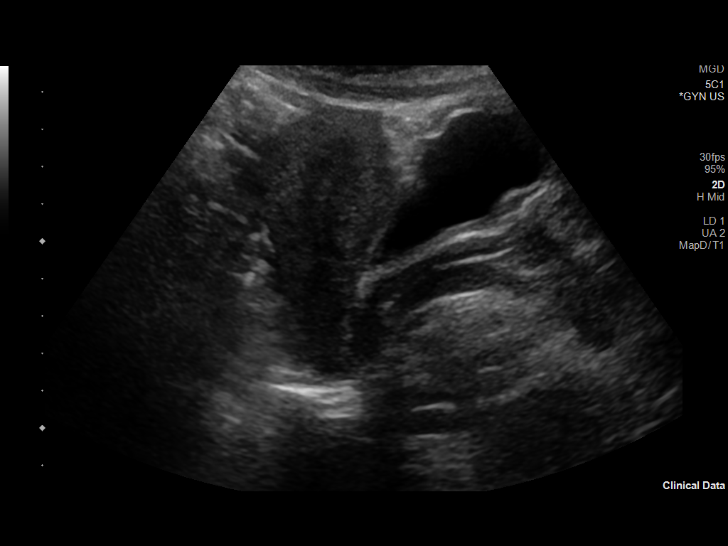
[im 12/138]
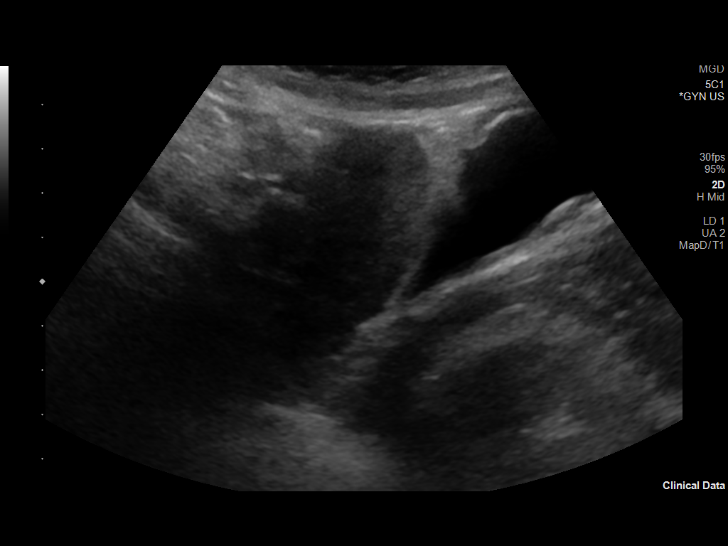
[im 23/138]
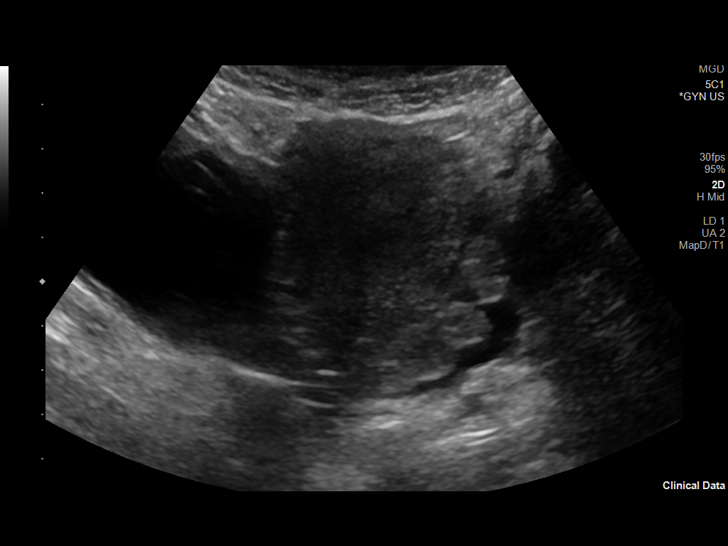
[im 35/138]
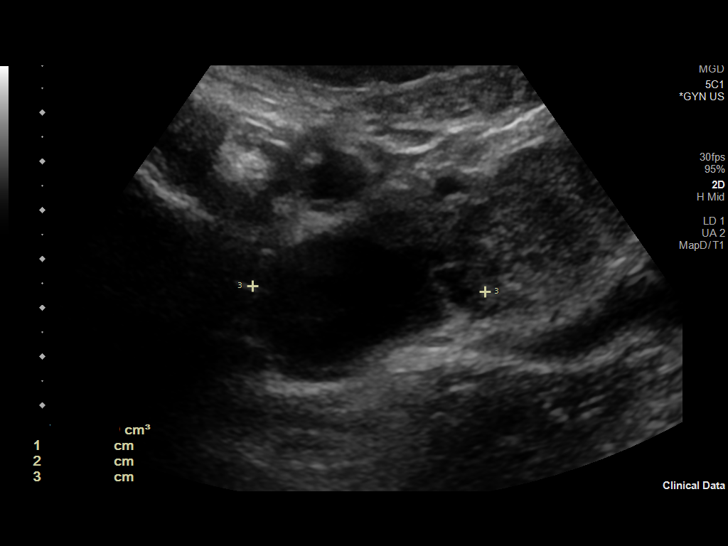
[im 46/138]
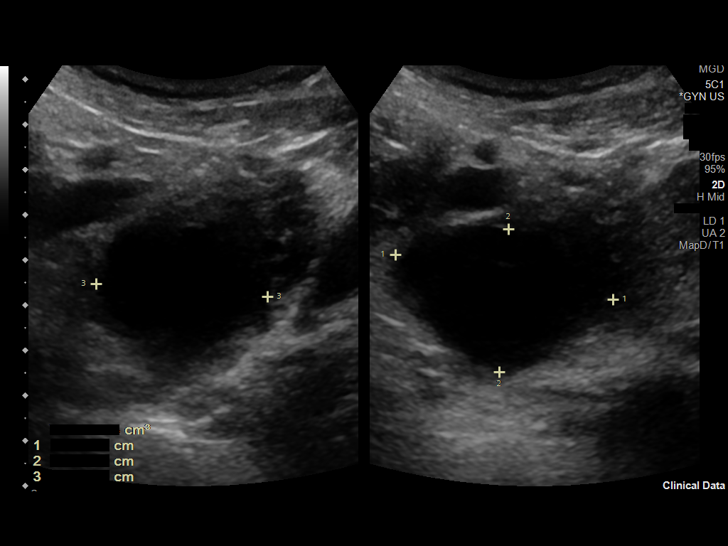
[im 58/138]
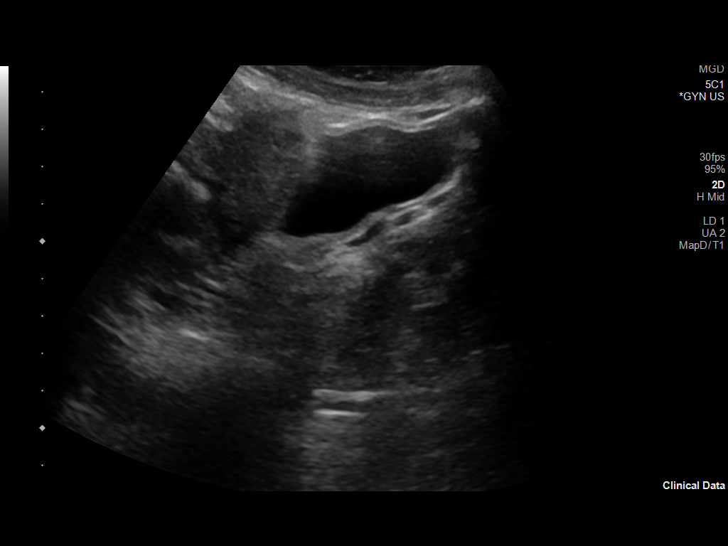
[im 69/138]
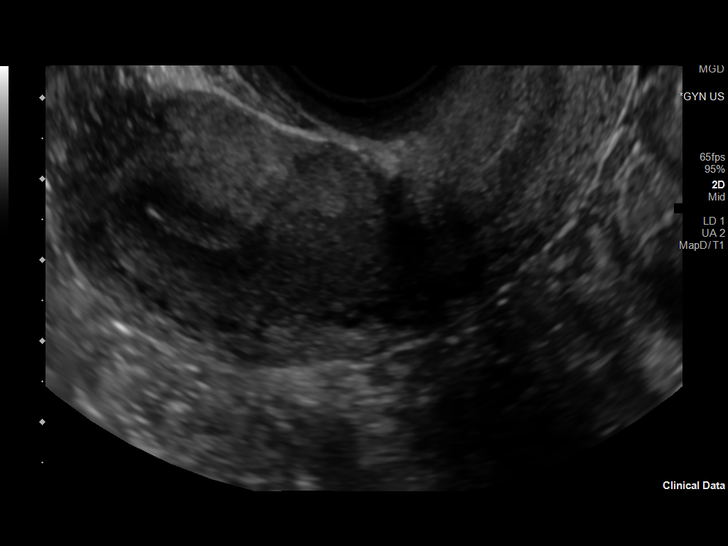
[im 80/138]
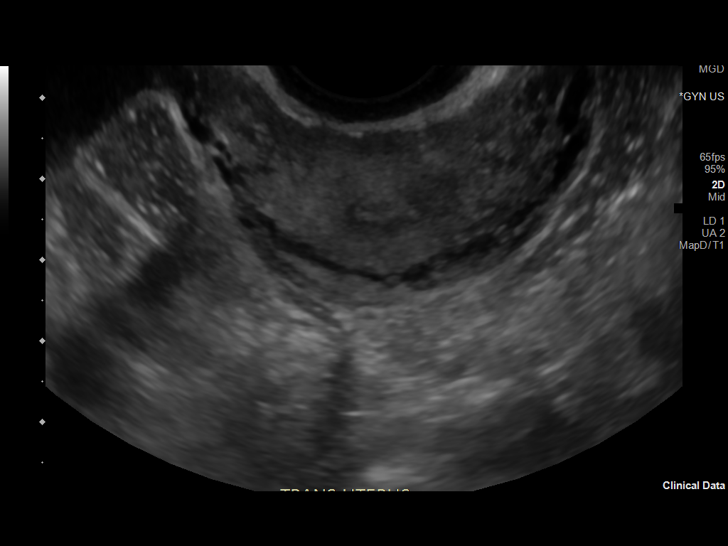
[im 92/138]
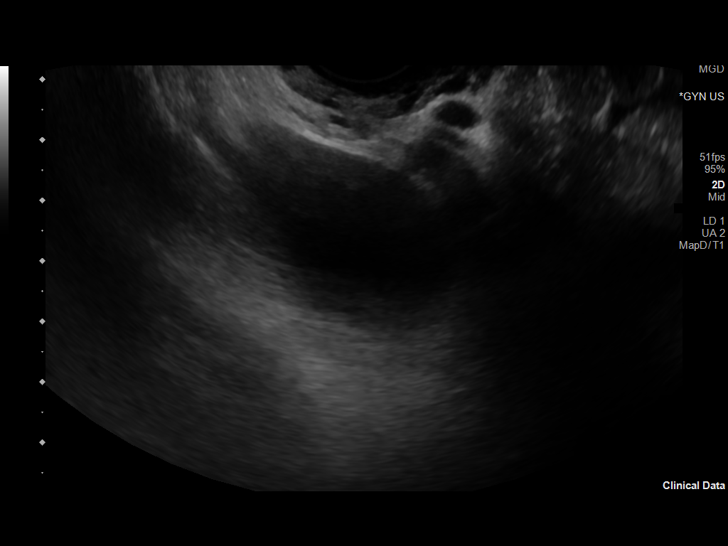
[im 103/138]
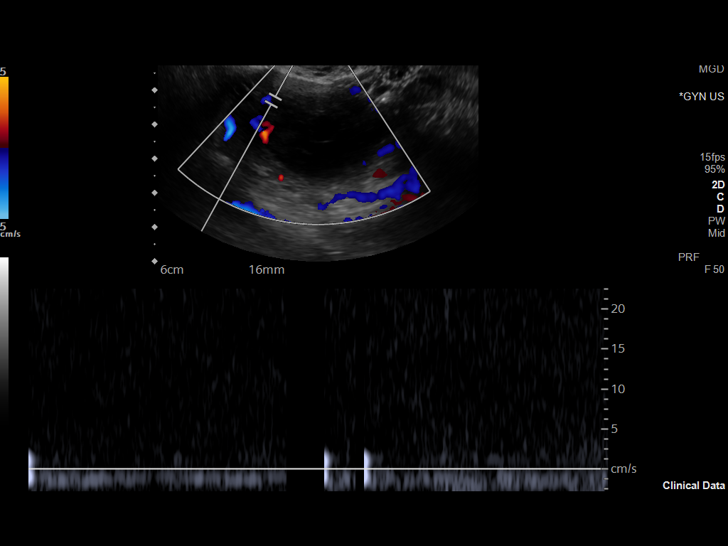
[im 115/138]
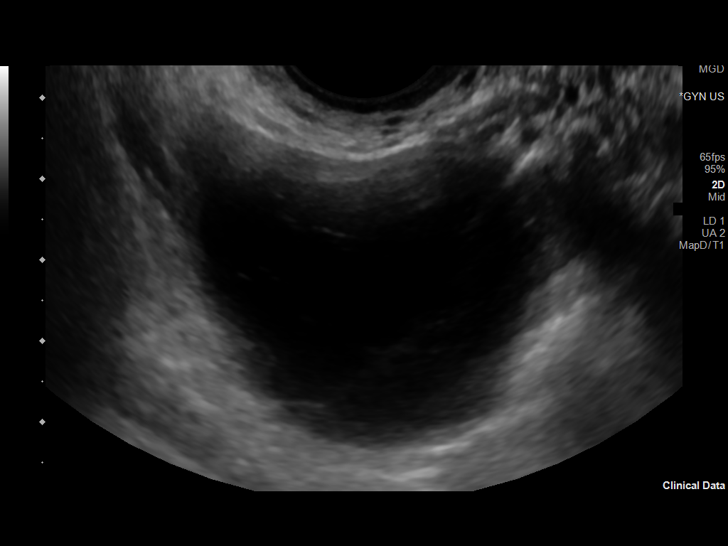
[im 126/138]
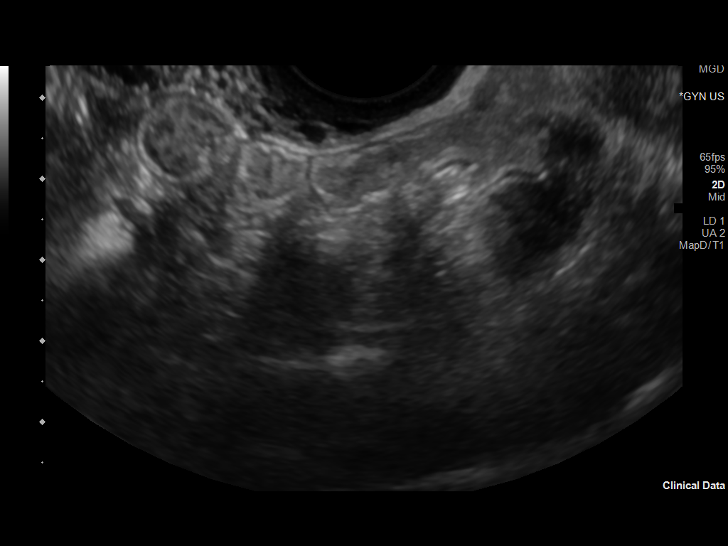
[im 138/138]
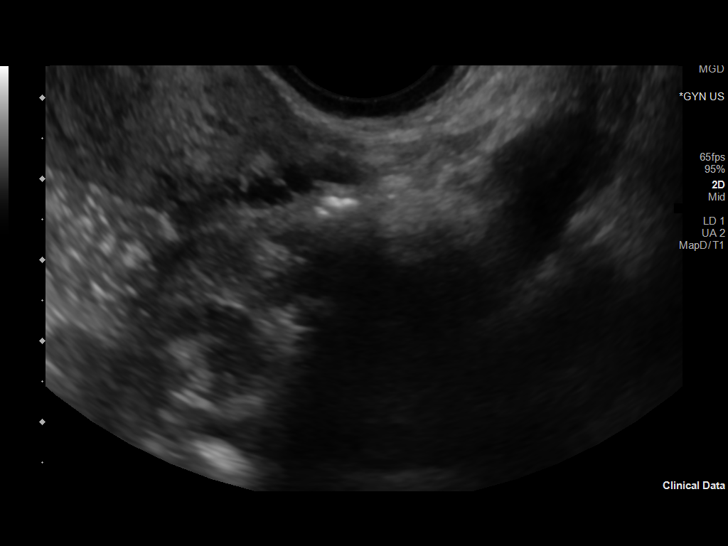

[13 of 25 positions shown; findings below may reference images not displayed]

FINDINGS: Uterus

Measurements: 7.6 x 3.3 x 4.5 cm = volume: 60 mL. No fibroids or
other mass visualized.

Endometrium

Thickness: 6 mm.  No focal abnormality visualized.

Right ovary

Measurements: 4.8 x 2.9 x 4.2 cm = volume: 31 mL. There is a 4.7 by
3.8 x 4.1 cm cyst with thin internal septations in the right ovary.

Left ovary

Not visualized.

Other findings

Moderate free fluid.
IMPRESSION: 1. 4.7 cm complex cyst in the right ovary may represent hemorrhagic
cysts. Follow-up pelvic ultrasound recommended in 2 months or 2
menstrual cycles to confirm resolution.
2. Moderate free fluid in the pelvis.
3. Nonvisualization of the left ovary.
